# Patient Record
Sex: Male | Born: 1995
Health system: Southern US, Community
[De-identification: ages and names within clinical notes are randomized; demographics above are authoritative.]

## PROBLEM LIST (undated history)

## (undated) DIAGNOSIS — F909 Attention-deficit hyperactivity disorder, unspecified type: Secondary | ICD-10-CM

## (undated) DIAGNOSIS — T7840XA Allergy, unspecified, initial encounter: Secondary | ICD-10-CM

## (undated) HISTORY — PX: TYMPANOSTOMY TUBE PLACEMENT: SHX32

## (undated) HISTORY — DX: Attention-deficit hyperactivity disorder, unspecified type: F90.9

## (undated) HISTORY — DX: Allergy, unspecified, initial encounter: T78.40XA

---
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
## 2017-04-03 ENCOUNTER — Emergency Department (HOSPITAL_COMMUNITY): Payer: Self-pay | Admitting: Emergency Medicine

## 2017-07-04 ENCOUNTER — Other Ambulatory Visit: Payer: Self-pay | Admitting: Family Medicine

## 2017-07-04 NOTE — Telephone Encounter (Signed)
Copied from CRM [phone]. Topic: Quick Communication - Rx Refill/Question >> Jul 04, 2017  4:59 PM Louie BunPalacios Medina, Rosey Batheresa D wrote: Medication: amphetamine-dextroamphetamine (ADDERALL) 15 MG tablet,FLUoxetine (PROZAC) 20 MG tablet  Has the patient contacted their pharmacy? Yes (Agent: If no, request that the patient contact the pharmacy for the refill.) (Agent: If yes, when and what did the pharmacy advise?)  Preferred Pharmacy (with phone number or street name): [id number] - [place], [place] - [address]  Agent: Please be advised that RX refills may take up to 3 business days. We ask that you follow-up with your pharmacy.

## 2017-07-05 NOTE — Telephone Encounter (Signed)
Rx refill request: Adderall 15 mg        Last filled: 12/18/16 # 60  LOV: 09/04/16  PCP: Burchette  Pharmacy: verified

## 2017-07-09 MED ORDER — AMPHETAMINE-DEXTROAMPHETAMINE 15 MG PO TABS: 15.0000 mg | ORAL_TABLET | Freq: Two times a day (BID) | ORAL | 0 refills | Status: DC

## 2017-07-09 NOTE — Telephone Encounter (Signed)
Refill once.  Needs office follow up within next month.

## 2017-08-01 ENCOUNTER — Other Ambulatory Visit: Payer: Self-pay | Admitting: *Deleted

## 2017-08-01 MED ORDER — FLUOXETINE HCL 20 MG PO TABS: 20.0000 mg | ORAL_TABLET | Freq: Every day | ORAL | 0 refills | Status: DC

## 2018-01-26 IMAGING — DX DG HAND COMPLETE 3+V*L*
3 series · 3 of 3 positions shown · non-contrast
Comparison: None.

CLINICAL DATA: Blunt trauma to the thumb with pain, initial
encounter

EXAM:
LEFT HAND - COMPLETE 3+ VIEW

[hand pa]
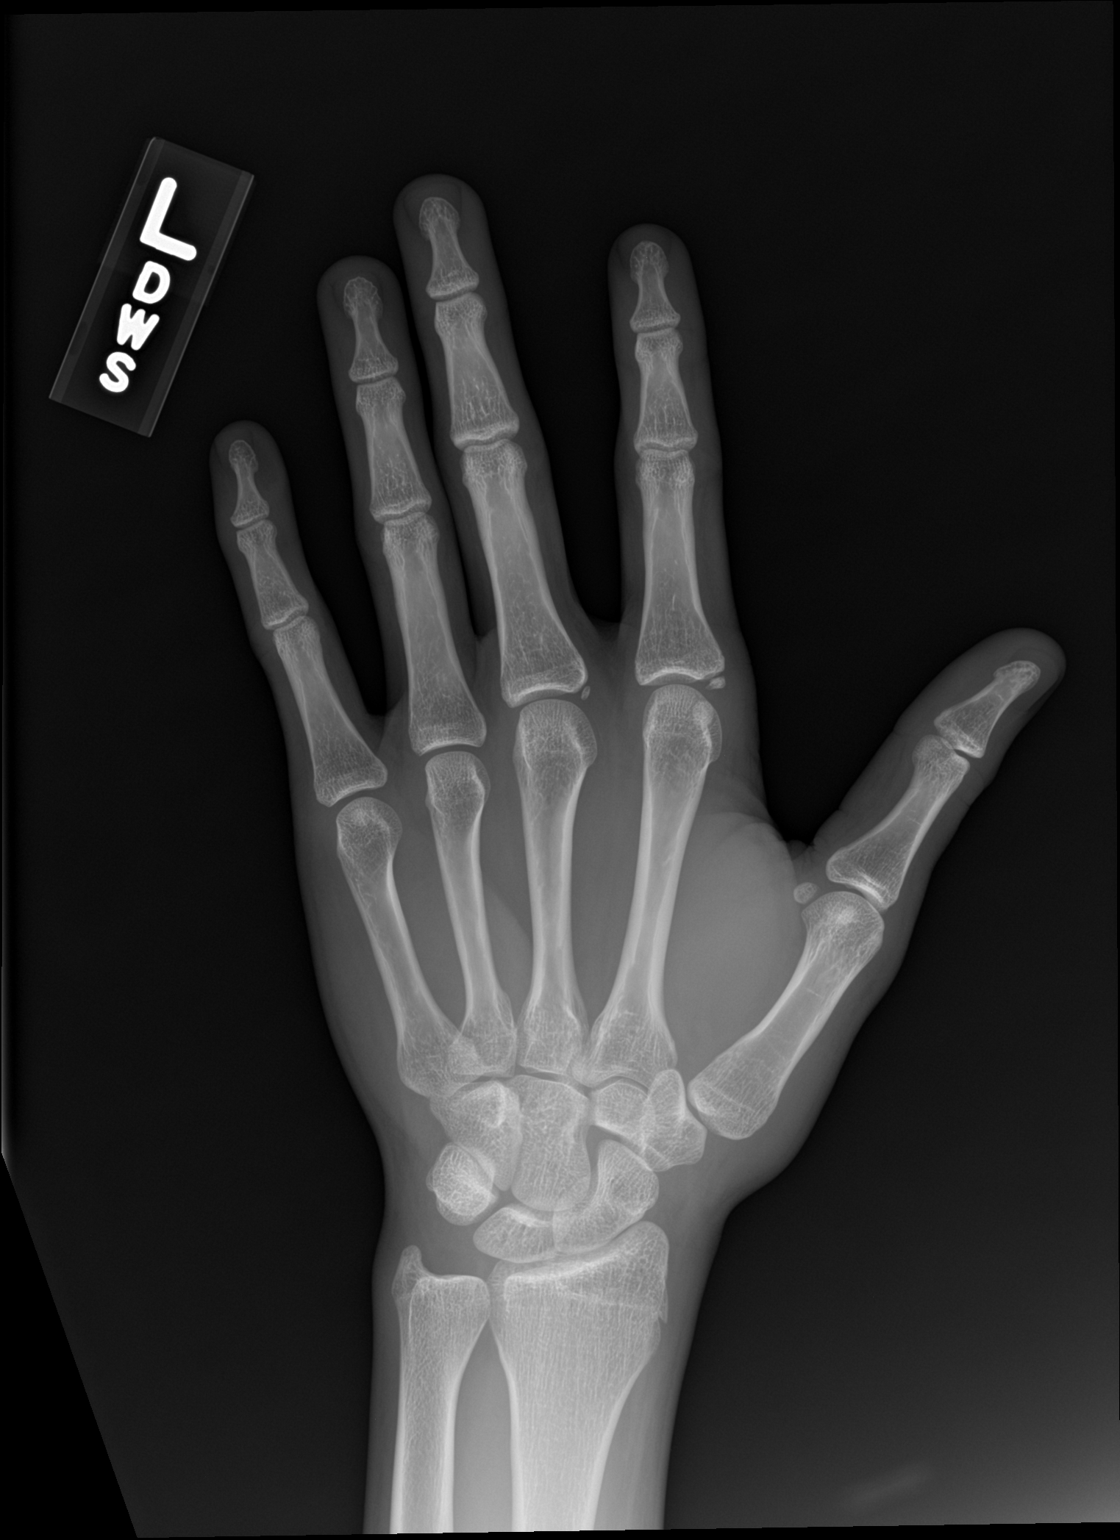

[hand obl]
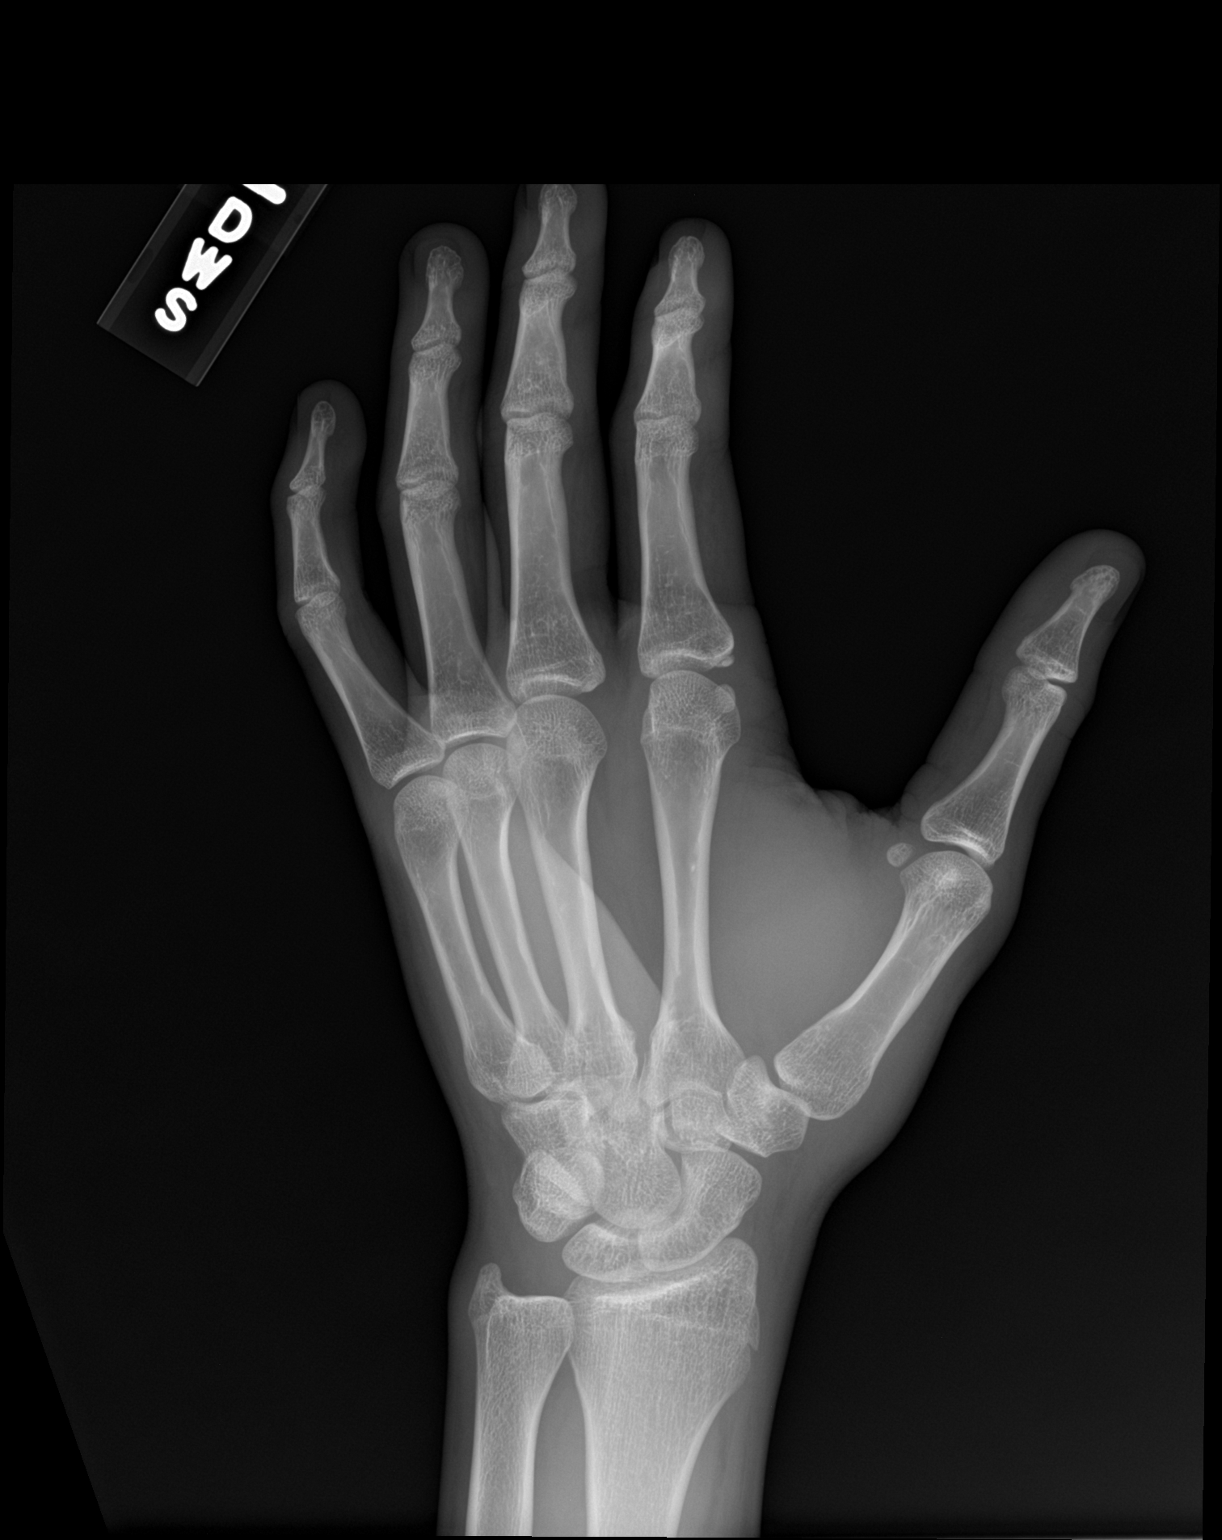

[hand lat]
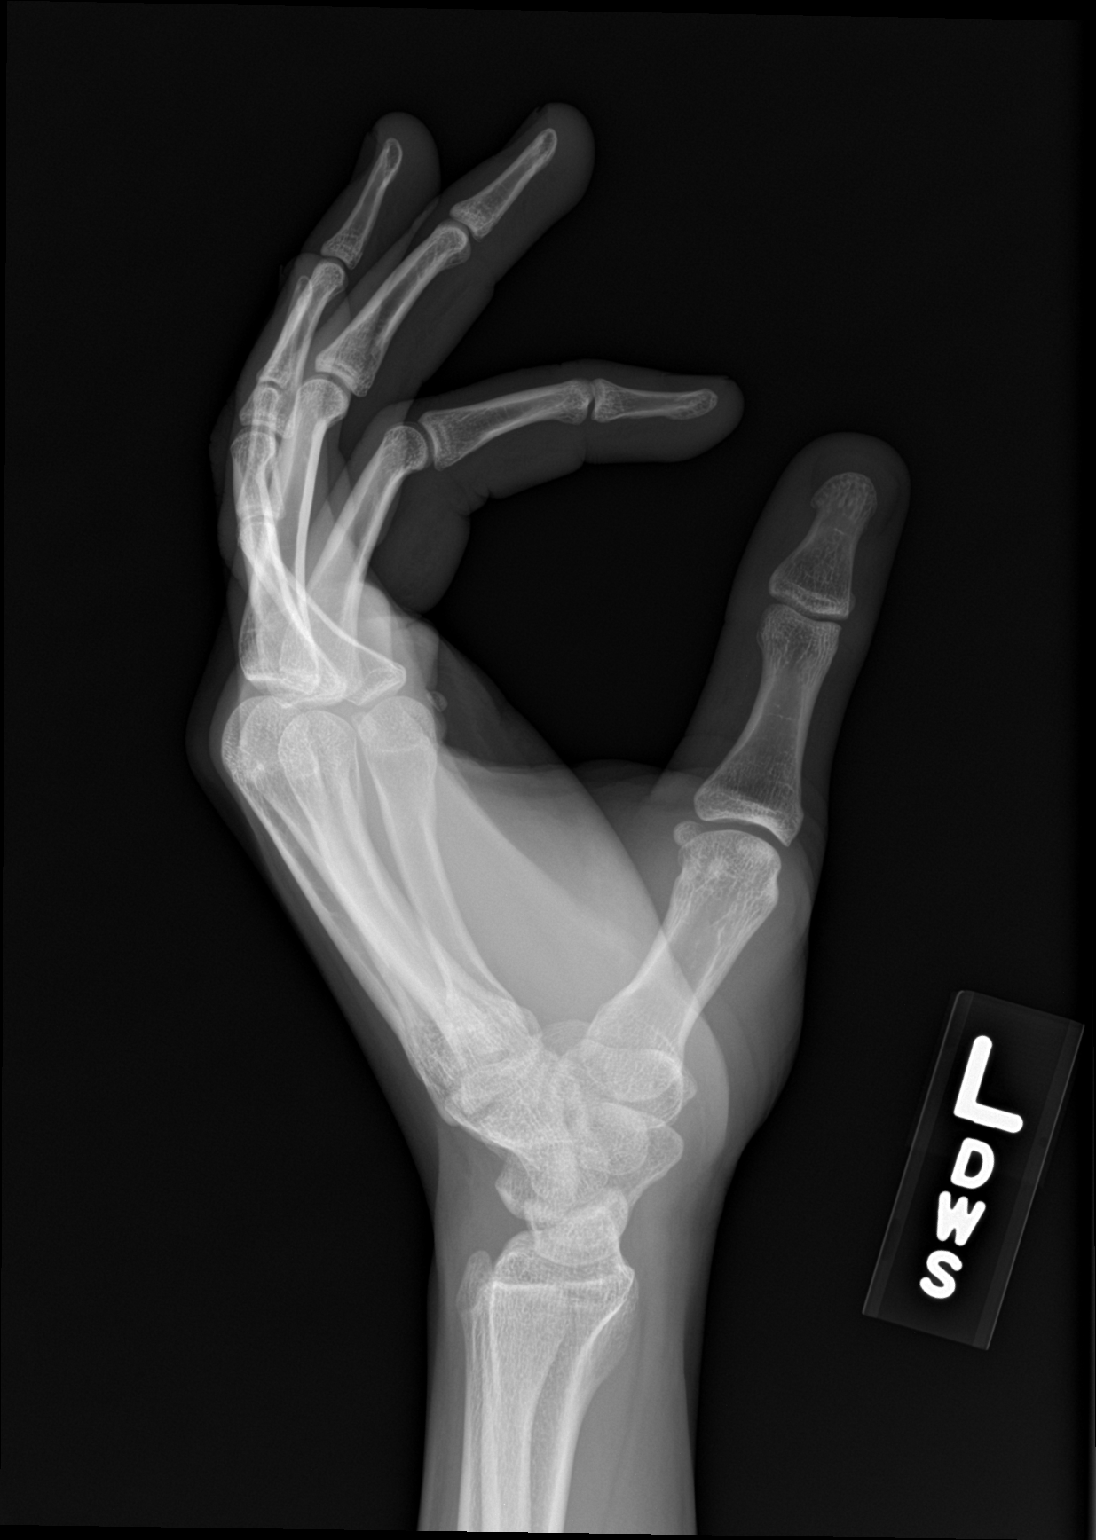

[3 of 3 positions shown; findings below may reference images not displayed]

FINDINGS: There are well corticated bony densities at the base of the second
and third proximal phalanges consistent with prior avulsion
fractures with nonunion. No acute fracture or dislocation is noted.
No soft tissue changes are seen.
IMPRESSION: Findings consistent with prior trauma. No acute abnormality is
noted.

## 2018-03-20 DIAGNOSIS — L0591 Pilonidal cyst without abscess: Secondary | ICD-10-CM | POA: Diagnosis not present

## 2018-03-21 DIAGNOSIS — L0231 Cutaneous abscess of buttock: Secondary | ICD-10-CM | POA: Diagnosis not present

## 2019-09-22 ENCOUNTER — Other Ambulatory Visit: Payer: Self-pay

## 2019-09-23 ENCOUNTER — Ambulatory Visit (INDEPENDENT_AMBULATORY_CARE_PROVIDER_SITE_OTHER): Payer: BLUE CROSS/BLUE SHIELD | Admitting: Family Medicine

## 2019-09-23 DIAGNOSIS — F988 Other specified behavioral and emotional disorders with onset usually occurring in childhood and adolescence: Secondary | ICD-10-CM | POA: Diagnosis not present

## 2019-09-23 DIAGNOSIS — Z91018 Allergy to other foods: Secondary | ICD-10-CM

## 2019-09-23 DIAGNOSIS — Z8659 Personal history of other mental and behavioral disorders: Secondary | ICD-10-CM | POA: Diagnosis not present

## 2019-09-23 MED ORDER — AMPHETAMINE-DEXTROAMPHETAMINE 15 MG PO TABS: 15.0000 mg | ORAL_TABLET | Freq: Two times a day (BID) | ORAL | 0 refills | Status: DC

## 2019-09-23 MED ORDER — EPINEPHRINE 0.3 MG/0.3ML IJ SOAJ: INTRAMUSCULAR | 1 refills | Status: AC

## 2019-09-23 NOTE — Progress Notes (Signed)
Established Patient Office Visit  Subjective:  Patient ID: Nicolas King, male    DOB: 1995-12-03  Age: 24 y.o. MRN: [id number]  CC: No chief complaint on file.   HPI [name] presents for medical follow-up.  He is attending [employer] in his last year.  He is studying Patent attorney.  He does have history of ADD.  He feels that he did well without medication last year with online courses but will be going back in person this year and feels he needs to go back on the Adderall.  Has previously taken Adderall 15 mg twice daily and this does seem to work well for him.  He does have history of depression.  He was started 3 years ago on Prozac for depression.  He was able to taper himself off eventually.  He feels his depression is relatively stable now.  He has a dog which is a mix between a lab and [place] shepherd and he feels that his dog has been 1 interval part of him coping better with depression.  He is specifically requesting a letter for an "emotional support animal "for his dog to be able to stay in his apartment.  Jermayne has history of severe nut allergy.  This is the peanuts and tree nuts.  He avoids these stringently.  Request refill of EpiPen to keep on hand at all times.  Past Medical History:  Diagnosis Date  . ADHD (attention deficit hyperactivity disorder)   . Allergy    diathesis    Past Surgical History:  Procedure Laterality Date  . TYMPANOSTOMY TUBE PLACEMENT      No family history on file.  Social History   Socioeconomic History  . Marital status: Single    Spouse name: Not on file  . Number of children: Not on file  . Years of education: Not on file  . Highest education level: Not on file  Occupational History  . Not on file  Tobacco Use  . Smoking status: Never Smoker  . Smokeless tobacco: Never Used  Substance and Sexual Activity  . Alcohol use: No  . Drug use: No  . Sexual activity: Not on file  Other Topics Concern  . Not on  file  Social History Narrative  . Not on file   Social Determinants of Health   Financial Resource Strain:   . Difficulty of Paying Living Expenses: Not on file  Food Insecurity:   . Worried About Programme researcher, broadcasting/film/video in the Last Year: Not on file  . Ran Out of Food in the Last Year: Not on file  Transportation Needs:   . Lack of Transportation (Medical): Not on file  . Lack of Transportation (Non-Medical): Not on file  Physical Activity:   . Days of Exercise per Week: Not on file  . Minutes of Exercise per Session: Not on file  Stress:   . Feeling of Stress : Not on file  Social Connections:   . Frequency of Communication with Friends and Family: Not on file  . Frequency of Social Gatherings with Friends and Family: Not on file  . Attends Religious Services: Not on file  . Active Member of Clubs or Organizations: Not on file  . Attends Banker Meetings: Not on file  . Marital Status: Not on file  Intimate Partner Violence:   . Fear of Current or Ex-Partner: Not on file  . Emotionally Abused: Not on file  . Physically Abused: Not on file  .  Sexually Abused: Not on file    Outpatient Medications Prior to Visit  Medication Sig Dispense Refill  . amphetamine-dextroamphetamine (ADDERALL) 15 MG tablet Take 1 tablet by mouth 2 (two) times daily. 60 tablet 0  . amphetamine-dextroamphetamine (ADDERALL) 15 MG tablet Take 1 tablet by mouth 2 (two) times daily. 60 tablet 0  . amphetamine-dextroamphetamine (ADDERALL) 15 MG tablet Take 1 tablet by mouth 2 (two) times daily. 60 tablet 0  . EPIPEN 2-PAK 0.3 MG/0.3ML SOAJ injection INJECT 0.3 MLS (0.3 MG TOTAL) INTO THE MUSCLE ONCE. 2 Device 0  . FLUoxetine (PROZAC) 20 MG tablet Take 1 tablet (20 mg total) by mouth daily. 90 tablet 0   No facility-administered medications prior to visit.    Allergies  Allergen Reactions  . Peanut-Containing Drug Products     REACTION: Epi pen for anaphylaxis    ROS Review of Systems    Constitutional: Negative for appetite change, chills, fever and unexpected weight change.  Respiratory: Negative for shortness of breath.   Cardiovascular: Negative for chest pain.  Neurological: Negative for dizziness and headaches.  Psychiatric/Behavioral: Negative for dysphoric mood.      Objective:    Physical Exam Vitals reviewed.  Constitutional:      Appearance: Normal appearance.  Cardiovascular:     Rate and Rhythm: Normal rate and regular rhythm.  Pulmonary:     Effort: Pulmonary effort is normal.     Breath sounds: Normal breath sounds.  Musculoskeletal:     Cervical back: Neck supple.  Lymphadenopathy:     Cervical: No cervical adenopathy.  Neurological:     Mental Status: He is alert.     There were no vitals taken for this visit. Wt Readings from Last 3 Encounters:  09/04/16 172 lb 8 oz (78.2 kg) (76 %, Z= 0.72)*  07/12/16 174 lb 4.8 oz (79.1 kg) (79 %, Z= 0.79)*  06/02/16 175 lb 1.6 oz (79.4 kg) (80 %, Z= 0.83)*   * Growth percentiles are based on CDC (Boys, 2-20 Years) data.     Health Maintenance Due  Topic Date Due  . Hepatitis C Screening  Never done  . HIV Screening  Never done  . TETANUS/TDAP  Never done  . INFLUENZA VACCINE  08/24/2019    There are no preventive care reminders to display for this patient.  Lab Results  Component Value Date   TSH 1.66 12/23/2010   Lab Results  Component Value Date   WBC 8.7 12/23/2010   HGB 12.8 (L) 12/23/2010   HCT 38.0 (L) 12/23/2010   MCV 84.3 12/23/2010   PLT 328.0 12/23/2010   Lab Results  Component Value Date   NA 141 12/23/2010   K 3.9 12/23/2010   CO2 27 12/23/2010   GLUCOSE 78 12/23/2010   BUN 17 12/23/2010   CREATININE 0.7 12/23/2010   BILITOT 0.2 (L) 12/23/2010   ALKPHOS 256 (H) 12/23/2010   AST 25 12/23/2010   ALT 21 12/23/2010   PROT 7.5 12/23/2010   ALBUMIN 4.4 12/23/2010   CALCIUM 9.2 12/23/2010   GFR 157.97 12/23/2010   No results found for: CHOL No results found for:  HDL No results found for: LDLCALC No results found for: TRIG No results found for: CHOLHDL No results found for: JXBJ4N    Assessment & Plan:   #1 attention deficit disorder.  Currently not on medication.  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  Currently stable off Prozac -Patient requesting letter for emotional support animal to keep his dog's apartment.  He does relate the fact that his dog has been integral part of him recovering from depression  Meds ordered this encounter  Medications  . EPINEPHrine (EPIPEN 2-PAK) 0.3 mg/0.3 mL IJ SOAJ injection    Sig: INJECT 0.3 MLS (0.3 MG TOTAL) INTO THE MUSCLE ONCE.    Dispense:  2 each    Refill:  1  . amphetamine-dextroamphetamine (ADDERALL) 15 MG tablet    Sig: Take 1 tablet by mouth 2 (two) times daily.    Dispense:  60 tablet    Refill:  0    Fill in two months  . amphetamine-dextroamphetamine (ADDERALL) 15 MG tablet    Sig: Take 1 tablet by mouth 2 (two) times daily.    Dispense:  60 tablet    Refill:  0    Fill in one month  . amphetamine-dextroamphetamine (ADDERALL) 15 MG tablet    Sig: Take 1 tablet by mouth 2 (two) times daily.    Dispense:  60 tablet    Refill:  0    Follow-up: No follow-ups on file.    Evelena Peat, MD

## 2019-09-25 ENCOUNTER — Encounter: Payer: Self-pay | Admitting: Family Medicine

## 2020-01-05 DIAGNOSIS — J101 Influenza due to other identified influenza virus with other respiratory manifestations: Secondary | ICD-10-CM | POA: Diagnosis not present

## 2020-01-05 DIAGNOSIS — Z20822 Contact with and (suspected) exposure to covid-19: Secondary | ICD-10-CM | POA: Diagnosis not present

## 2020-01-05 DIAGNOSIS — J029 Acute pharyngitis, unspecified: Secondary | ICD-10-CM | POA: Diagnosis not present

## 2020-01-06 ENCOUNTER — Telehealth: Payer: BC Managed Care – PPO | Admitting: Family Medicine

## 2020-03-10 ENCOUNTER — Other Ambulatory Visit: Payer: Self-pay

## 2020-03-10 ENCOUNTER — Encounter (HOSPITAL_COMMUNITY): Payer: Self-pay

## 2020-03-10 ENCOUNTER — Emergency Department
Admission: EM | Admit: 2020-03-10 | Discharge: 2020-03-10 | Disposition: A | Discharge status: Home / Self Care | Attending: Emergency Medicine | Admitting: Emergency Medicine

## 2020-03-10 DIAGNOSIS — S0101XA Laceration without foreign body of scalp, initial encounter: Secondary | ICD-10-CM | POA: Insufficient documentation

## 2020-03-10 DIAGNOSIS — Z23 Encounter for immunization: Secondary | ICD-10-CM | POA: Insufficient documentation

## 2020-03-10 DIAGNOSIS — Y990 Civilian activity done for income or pay: Secondary | ICD-10-CM | POA: Insufficient documentation

## 2020-03-10 DIAGNOSIS — W208XXA Other cause of strike by thrown, projected or falling object, initial encounter: Secondary | ICD-10-CM | POA: Insufficient documentation

## 2020-03-10 MED ORDER — DIPHTH,PERTUSSIS(ACEL),TETANUS 2.5 LF UNIT-8 MCG-5 LF/0.5ML IM SYRINGE
0.5000 mL | INJECTION | INTRAMUSCULAR | Status: AC
Start: 2020-03-10 — End: 2020-03-10
  Administered 2020-03-10: 11:00:00 0.5 mL via INTRAMUSCULAR
  Filled 2020-03-10: qty 0.5

## 2020-03-10 MED ORDER — BACITRACIN 500 UNIT/GRAM TOPICAL PACKET
1.0000 | PACK | CUTANEOUS | Status: AC
Start: 2020-03-10 — End: 2020-03-10
  Administered 2020-03-10: 1 via TOPICAL

## 2020-03-10 NOTE — ED Provider Notes (Signed)
Department of Emergency Medicine  [facility]  03/10/2020            Patient is a 25 y.o. male presenting to the ED with chief complaint of Head Laceration      HPI   This is a 25 year old male who presents to the ED via private vehicle with the above.  Patient reports he sustained injury to the left side of his head this morning around 0930 when a box fell and hit him on the head causing a laceration.  Denies other injuries as well as any loss of consciousness.  Denies any nausea, vomiting, visual changes and no other concerns voiced.  He is not diabetic.    History reviewed. No pertinent past medical history.  History reviewed. No pertinent surgical history.  No Known Allergies   No current outpatient medications on file.      Above history reviewed.     Review of Systems   Gastrointestinal: Negative for nausea and vomiting.   Musculoskeletal: Negative for back pain and neck pain.   Neurological: Negative for dizziness, loss of consciousness and headaches.        All other pertinent systems reviewed and are negative, unless commented on in the HPI.       Filed Vitals:    03/10/20 1008   BP: 135/86   Pulse: 79   Resp: 16   Temp: 36.7 C (98 F)   SpO2: 100%       Physical Exam  Vitals reviewed.   Constitutional:       Comments: This is a pleasant well-nourished well-developed 25 year old male seen sitting in bed resting comfortably, nontoxic in appearance, no acute distress, he is noted have a superficial laceration to the left scalp, bleeding controlled   HENT:      Head: Normocephalic.      Comments: Scalp laceration     Right Ear: Tympanic membrane normal.      Left Ear: Tympanic membrane normal.      Mouth/Throat:      Mouth: Mucous membranes are moist.      Pharynx: Oropharynx is clear.   Eyes:      Pupils: Pupils are equal, round, and reactive to light.   Cardiovascular:      Rate and Rhythm: Normal rate and regular rhythm.      Heart sounds: Normal heart sounds.   Pulmonary:      Breath  sounds: Normal breath sounds.   Musculoskeletal:      Cervical back: Normal range of motion and neck supple.      Comments: MAEW   Skin:     General: Skin is warm and dry.   Neurological:      General: No focal deficit present.      Mental Status: He is oriented to person, place, and time.   Psychiatric:         Behavior: Behavior normal.          LAC REPAIR/WOUND CLOSURE    Date/Time: 03/10/2020 11:12 AM  Performed by: Sharlotte Alamo, DO  Authorized by: Sharlotte Alamo, DO     Consent:     Consent obtained:  Verbal  Anesthesia:     Anesthesia method:  Local infiltration    Local anesthetic:  Lidocaine 1% WITH epi  Laceration details:     Location:  Scalp    Length (cm):  1.5    Depth (mm):  1  Exploration:     Limited defect  created (wound extended): no      Wound exploration: entire depth of wound visualized    Treatment:     Area cleansed with:  Saline    Amount of cleaning:  Standard    Irrigation solution:  Sterile saline    Irrigation volume:  300    Irrigation method:  Syringe    Visualized foreign bodies/material removed: no      Debridement:  None  Skin repair:     Repair method:  Staples    Number of staples:  2  Approximation:     Approximation:  Close  Post-procedure details:     Dressing:  Antibiotic ointment    Procedure completion:  Tolerated well, no immediate complications         Workup:     No results found for this or any previous visit (from the past 24 hour(s)).         Orders Placed This Encounter    BEDSIDE  WOUND CLOSURE / LAC REPAIR    bacitracin 500 units/gram topical ointment packet    diphtheria, pertussis-acell, tetanus (BOOSTRIX) IM injection           Abnormal Lab results:  Labs Reviewed - No data to display    ER Course:  Appropriate labs and imaging ordered. Medical Records reviewed.   During the patient's stay in the emergency department, the above listed imaging and/or labs were performed to assist with medical decision making and were reviewed by myself when available for  review.       Pt remained stable throughout the emergency department course.      Impression:    Encounter Diagnosis   Name Primary?    Laceration of scalp, initial encounter Yes      Disposition:  Discharged     Follow up with primary provider of choice or with Nunzio Cobbs, PA-C next week.  Keep area clean and dry and monitor for signs of infection including redness, discharge, fever.  Return to ER for wound check and possible staple removal in 5-7 days, sooner with bleeding, signs of infection, confusion, or any concerns.    Follow Up Plan:  Follow-up with PCP or return to the emergency room with any worsening symptoms.    No follow-ups on file.   New Prescriptions    No medications on file        Sharlotte Alamo, DO

## 2020-03-10 NOTE — ED Nurses Note (Signed)
Unknown tetanus status

## 2020-03-10 NOTE — Discharge Instructions (Signed)
Follow up with primary provider of choice or with Nunzio Cobbs, PA-C next week.  Keep area clean and dry and monitor for signs of infection including redness, discharge, fever.  Return to ER for wound check and possible staple removal in 5-7 days, sooner with bleeding, signs of infection, confusion, or any concerns.

## 2020-03-10 NOTE — ED Triage Notes (Signed)
Box fell and hit pt in the head at around 0930 this am at work.  approx 1" lac to lt side of head.

## 2020-03-17 ENCOUNTER — Emergency Department
Admission: EM | Admit: 2020-03-17 | Discharge: 2020-03-17 | Disposition: A | Discharge status: Home / Self Care | Payer: 59 | Attending: Emergency Medicine | Admitting: Emergency Medicine

## 2020-03-17 ENCOUNTER — Encounter (HOSPITAL_COMMUNITY): Payer: Self-pay

## 2020-03-17 DIAGNOSIS — Z4802 Encounter for removal of sutures: Secondary | ICD-10-CM

## 2020-03-17 DIAGNOSIS — S0101XD Laceration without foreign body of scalp, subsequent encounter: Secondary | ICD-10-CM | POA: Insufficient documentation

## 2020-03-17 DIAGNOSIS — Z5189 Encounter for other specified aftercare: Secondary | ICD-10-CM

## 2020-03-17 NOTE — Discharge Instructions (Addendum)
Continue to keep wound clean and dry.  Follow-up with your PCP as needed.

## 2020-03-17 NOTE — ED Triage Notes (Signed)
Patient states injury last Wednesday here for staple removal

## 2020-03-17 NOTE — ED Nurses Note (Signed)
Patient reports talking to provider about discharge plan of care. Declines assistance to vehicle. No concerns. Verbalizes understanding of discharge instructions.

## 2020-03-17 NOTE — ED Provider Notes (Signed)
Department of Emergency Medicine  [facility]  03/17/2020      Patient is a 25 y.o. male presenting to the ED with chief complaint of Staple Removal       HPI   25 year old male presents to the emergency department for recheck of scalp wound and staple removal.  Injury occurred 7 days ago where a box hit him in the head.  He denies any increased pain, headache or wound complications.    Review of Systems   HENT: Negative for ear pain.    Eyes: Negative for double vision.   Gastrointestinal: Negative for nausea and vomiting.   Musculoskeletal: Negative for neck pain.   Skin:        Scalp wound   Neurological: Negative for dizziness and headaches.   All other systems reviewed and are negative.       All other systems reviewed and are negative, unless commented on in the HPI.     History reviewed. No pertinent past medical history.  History reviewed. No pertinent surgical history.  No Known Allergies   No current outpatient medications on file.      Above history reviewed with patient.  Previous records also reviewed.     Physical Exam:     Filed Vitals:    03/17/20 1715   BP: 136/82   Pulse: 60   Resp: 16   Temp: 36.4 C (97.5 F)   SpO2: 100%       Physical Exam  Vitals and nursing note reviewed.   Constitutional:       Appearance: Normal appearance.   HENT:      Head: Normocephalic.      Mouth/Throat:      Mouth: Mucous membranes are moist.   Eyes:      Extraocular Movements: Extraocular movements intact.      Pupils: Pupils are equal, round, and reactive to light.   Cardiovascular:      Rate and Rhythm: Normal rate.   Pulmonary:      Effort: Pulmonary effort is normal. No respiratory distress.   Abdominal:      General: There is no distension.   Musculoskeletal:         General: Normal range of motion.      Cervical back: Neck supple.   Skin:     General: Skin is warm and dry.      Comments: Two staples removed from well-healed scalp laceration.   Neurological:      General: No focal deficit present.       Mental Status: He is alert.   Psychiatric:         Mood and Affect: Mood normal.         Behavior: Behavior normal.        Therapy/Procedures/Course/MDM:    Patient vitally stable throughout stay in the ED.   Staples removed from scalp laceration.  Discussed continuation of wound care.     Impression:    Encounter Diagnoses   Name Primary?    Encounter for staple removal Yes    Visit for wound check        Disposition:    Discharged     Following the above history, physical exam, and studies, the patient was deemed stable and suitable for discharge.    It was advised that the patient return to the ED with any new, concerning or worsening symptoms and follow up as directed.    Return precautions reviewed.    The  patient verbalized understanding of all instructions, had no further questions or concerns, and agreed to plan.      Follow Up:    [facility]  [address]  [place] [address]  [phone]    As needed    No future appointments.     Prescriptions:      Current Discharge Medication List      You have not been prescribed any medications.         Attestation:     The co-signing Dr was present in the ED and available for consultation and did participate in the care of the patient.     This note was partly generated using a voice recognition software; please excuse any grammatical errors.       Almond Lint, PA-C  03/17/2020, 17:19

## 2020-03-22 ENCOUNTER — Encounter (INDEPENDENT_AMBULATORY_CARE_PROVIDER_SITE_OTHER): Payer: Self-pay | Admitting: Nurse Practitioner

## 2020-03-22 ENCOUNTER — Ambulatory Visit: Payer: 59 | Attending: Nurse Practitioner | Admitting: Nurse Practitioner

## 2020-03-22 ENCOUNTER — Other Ambulatory Visit: Payer: Self-pay

## 2020-03-22 VITALS — BP 134/84 | HR 66 | Temp 98.1°F | Resp 20 | Ht 70.0 in | Wt 165.0 lb

## 2020-03-22 DIAGNOSIS — H1032 Unspecified acute conjunctivitis, left eye: Secondary | ICD-10-CM | POA: Insufficient documentation

## 2020-03-22 DIAGNOSIS — L309 Dermatitis, unspecified: Secondary | ICD-10-CM | POA: Insufficient documentation

## 2020-03-22 MED ORDER — POLYMYXIN B SULFATE 10,000 UNIT-TRIMETHOPRIM 1 MG/ML EYE DROPS
1.0000 [drp] | Freq: Four times a day (QID) | OPHTHALMIC | 0 refills | Status: AC
Start: 2020-03-22 — End: 2020-03-29

## 2020-03-22 NOTE — Progress Notes (Signed)
URGENT CARE, [facility]  [address]  [place] [address]  Phone: [phone]  Fax: [phone]    Encounter Date: 03/22/2020    Patient ID:  Luke Lara  OZD:[id number]    DOB: 08-29-1995  Age: 25 y.o. male    Subjective:     Chief Complaint   Patient presents with    Pink Eye     Right eye      The patient presents to the clinic with left eye redness that started yesterday morning. He was bitten by a lady bug yesterday morning. He has significant allergies to lady bugs. His left eye was matted shut this morning and he was sent home from work.     The history is provided by the patient.   Pink Eye  Associated symptoms include a rash. Pertinent negatives include no chest pain, chills, congestion, coughing, fever, nausea, sore throat or vomiting.     Current Outpatient Medications   Medication Sig    polymyxin B sulf-trimethoprim (POLYTRIM) 10,000 unit- 1 mg/mL Ophthalmic Drops Administer 1 Drop into the left eye Every 6 hours for 7 days     No Known Allergies  History reviewed. No pertinent past medical history.        Family Medical History:    None         Social History     Tobacco Use    Smoking status: Current Every Day Smoker     Packs/day: 0.50    Smokeless tobacco: Never Used   Vaping Use    Vaping Use: Every day       Review of Systems   Constitutional: Negative for chills and fever.   HENT: Negative for congestion, ear pain, rhinorrhea and sore throat.    Eyes: Positive for discharge, redness and itching.   Respiratory: Negative for cough, shortness of breath and wheezing.    Cardiovascular: Negative for chest pain.   Gastrointestinal: Negative for diarrhea, nausea and vomiting.   Skin: Positive for rash.     Objective:   Vitals: BP 134/84 (Site: Right, Patient Position: Sitting, Cuff Size: Adult)    Pulse 66    Temp 36.7 C (98.1 F) (Temporal)    Resp 20    Ht 1.778 m (5\' 10" )    Wt 74.8 kg (165 lb)    SpO2 98%    BMI 23.68 kg/m         Physical Exam  Constitutional:        General: He is not in acute distress.  HENT:      Head: Normocephalic and atraumatic.      Right Ear: Tympanic membrane and ear canal normal.      Left Ear: Tympanic membrane and ear canal normal.      Nose: Nose normal.      Mouth/Throat:      Pharynx: Oropharynx is clear.   Eyes:      Conjunctiva/sclera:      Right eye: Right conjunctiva is not injected.      Left eye: Left conjunctiva is injected.   Cardiovascular:      Rate and Rhythm: Normal rate and regular rhythm.      Heart sounds: No murmur heard.  Pulmonary:      Breath sounds: No wheezing, rhonchi or rales.   Musculoskeletal:      Cervical back: Neck supple.   Lymphadenopathy:      Cervical: No cervical adenopathy.   Skin:     General:  Skin is warm and dry.      Findings: Rash (mildly erythematous papular rash of bilateral AC fossa) present.   Neurological:      General: No focal deficit present.      Mental Status: He is alert and oriented to person, place, and time.         Assessment & Plan:     ENCOUNTER DIAGNOSES     ICD-10-CM   1. Acute bacterial conjunctivitis of left eye  H10.32   2. Eczema, unspecified type  L30.9       Orders Placed This Encounter    polymyxin B sulf-trimethoprim (POLYTRIM) 10,000 unit- 1 mg/mL Ophthalmic Drops     Work excuse provided  Polytrim eye gtts as directed  Pt declined Rx for steroid cream for his eczema, he has been using hydrocortisone cream  Advised applying an emollient like vaseline over the hydrocortisone cream 1-2 times per day to control eczema  Return if symptoms worsen or fail to improve.    Marianna Fuss, NP

## 2020-06-05 DIAGNOSIS — Z20822 Contact with and (suspected) exposure to covid-19: Secondary | ICD-10-CM | POA: Diagnosis not present

## 2020-06-05 DIAGNOSIS — Z03818 Encounter for observation for suspected exposure to other biological agents ruled out: Secondary | ICD-10-CM | POA: Diagnosis not present

## 2020-08-20 ENCOUNTER — Telehealth: Payer: Self-pay | Admitting: Family Medicine

## 2020-08-20 MED ORDER — AMPHETAMINE-DEXTROAMPHETAMINE 15 MG PO TABS: 15.0000 mg | ORAL_TABLET | Freq: Two times a day (BID) | ORAL | 0 refills | Status: DC

## 2020-08-20 NOTE — Telephone Encounter (Signed)
Pt call and stated he need a refill on amphetamine-dextroamphetamine (ADDERALL) 15 MG tablet sent to  [id number] - [place], [place] - [address] Phone:  [phone]  Fax:  [phone]

## 2020-08-20 NOTE — Telephone Encounter (Signed)
Refilled once.  Does need office follow-up prior to further refills.  Make sure he is aware to set up.  Also confirm that correct pharmacy is [facility] [place].

## 2021-09-19 ENCOUNTER — Ambulatory Visit (INDEPENDENT_AMBULATORY_CARE_PROVIDER_SITE_OTHER): Payer: BC Managed Care – PPO | Admitting: Family Medicine

## 2021-09-19 ENCOUNTER — Encounter: Payer: Self-pay | Admitting: Family Medicine

## 2021-09-19 VITALS — BP 120/70 | HR 70 | Temp 98.2°F | Ht 71.0 in | Wt 230.2 lb

## 2021-09-19 DIAGNOSIS — F988 Other specified behavioral and emotional disorders with onset usually occurring in childhood and adolescence: Secondary | ICD-10-CM

## 2021-09-19 MED ORDER — AMPHETAMINE-DEXTROAMPHETAMINE 10 MG PO TABS: 10.0000 mg | ORAL_TABLET | Freq: Two times a day (BID) | ORAL | 0 refills | Status: AC

## 2021-09-19 NOTE — Progress Notes (Signed)
   Established Patient Office Visit  Subjective   Patient ID: Nicolas King, male    DOB: 1995/08/26  Age: 26 y.o. MRN: [id number]  Chief Complaint  Patient presents with   Medication Refill    HPI   Here for follow-up regarding ADD.  He does not take Adderall daily.  Had been on dose of 15 mg but was having consistent headaches.  When he broke the tablet in half his headaches went away.  He would like to consider lower dose such as 10 mg.  Has been able to focus with lower dose very well.  Continues to work for [employer].  Lives in [place].  Doing very well overall.  Considering going back to [employer] for masters degree.  No recent chest pains.  Past Medical History:  Diagnosis Date   ADHD (attention deficit hyperactivity disorder)    Allergy    diathesis   Past Surgical History:  Procedure Laterality Date   TYMPANOSTOMY TUBE PLACEMENT      reports that he has never smoked. He has never used smokeless tobacco. He reports that he does not drink alcohol and does not use drugs. family history is not on file. Allergies  Allergen Reactions   Peanut-Containing Drug Products     REACTION: Epi pen for anaphylaxis    Review of Systems  Constitutional:  Negative for malaise/fatigue.  Eyes:  Negative for blurred vision.  Respiratory:  Negative for shortness of breath.   Cardiovascular:  Negative for chest pain.  Neurological:  Negative for dizziness and weakness.       See HPI      Objective:     BP 120/70 (BP Location: Left Arm, Patient Position: Sitting, Cuff Size: Normal)   Pulse 70   Temp 98.2 F (36.8 C) (Oral)   Ht 5\' 11"  (1.803 m)   Wt 230 lb 4 oz (104.4 kg)   SpO2 98%   BMI 32.11 kg/m  BP Readings from Last 3 Encounters:  09/19/21 120/70  09/04/16 100/70  07/12/16 120/80   Wt Readings from Last 3 Encounters:  09/19/21 230 lb 4 oz (104.4 kg)  09/04/16 172 lb 8 oz (78.2 kg) (76 %, Z= 0.72)*  07/12/16 174 lb 4.8 oz (79.1 kg) (79 %, Z= 0.79)*    * Growth percentiles are based on CDC (Boys, 2-20 Years) data.      Physical Exam Vitals reviewed.  Constitutional:      Appearance: Normal appearance.  Cardiovascular:     Rate and Rhythm: Normal rate and regular rhythm.  Pulmonary:     Effort: Pulmonary effort is normal.     Breath sounds: Normal breath sounds.  Musculoskeletal:     Right lower leg: No edema.     Left lower leg: No edema.  Neurological:     Mental Status: He is alert.      No results found for any visits on 09/19/21.    The ASCVD Risk score (Arnett DK, et al., 2019) failed to calculate for the following reasons:   The 2019 ASCVD risk score is only valid for ages 88 to 110    Assessment & Plan:   Problem List Items Addressed This Visit       Unprioritized   Attention deficit disorder - Primary  We will try lower dose of Adderall at 10 mg once or twice daily as needed.  Refill for #60  No follow-ups on file.    76, MD

## 2021-09-23 ENCOUNTER — Ambulatory Visit (INDEPENDENT_AMBULATORY_CARE_PROVIDER_SITE_OTHER): Payer: Self-pay | Admitting: Otolaryngology

## 2021-10-10 ENCOUNTER — Ambulatory Visit (INDEPENDENT_AMBULATORY_CARE_PROVIDER_SITE_OTHER): Payer: 59 | Admitting: Otolaryngology

## 2022-01-04 ENCOUNTER — Ambulatory Visit (INDEPENDENT_AMBULATORY_CARE_PROVIDER_SITE_OTHER): Payer: BC Managed Care – PPO | Admitting: Family Medicine

## 2022-01-04 ENCOUNTER — Encounter: Payer: Self-pay | Admitting: Family Medicine

## 2022-01-04 VITALS — BP 120/78 | HR 89 | Temp 98.3°F | Wt 229.5 lb

## 2022-01-04 DIAGNOSIS — J029 Acute pharyngitis, unspecified: Secondary | ICD-10-CM

## 2022-01-04 DIAGNOSIS — J020 Streptococcal pharyngitis: Secondary | ICD-10-CM

## 2022-01-04 LAB — POCT INFLUENZA A/B
Influenza A, POC: NEGATIVE
Influenza B, POC: NEGATIVE

## 2022-01-04 LAB — POC COVID19 BINAXNOW: SARS Coronavirus 2 Ag: NEGATIVE

## 2022-01-04 LAB — POCT RAPID STREP A (OFFICE): Rapid Strep A Screen: POSITIVE — AB

## 2022-01-04 MED ORDER — AMOXICILLIN 875 MG PO TABS: 875.0000 mg | ORAL_TABLET | Freq: Two times a day (BID) | ORAL | 0 refills | Status: AC

## 2022-01-04 NOTE — Progress Notes (Signed)
Established Patient Office Visit  Subjective   Patient ID: Nicolas King, male    DOB: Apr 04, 1995  Age: 26 y.o. MRN: [id number]  Chief Complaint  Patient presents with   Sore Throat    HPI   Nicolas King seen with sore throat which started yesterday.  No fever.  No chills.  No significant cough or nasal congestion.  No skin rashes.  No known sick contacts.  Denies any nausea or vomiting.  Has had strep throat frequently in the past.  Mild malaise.  Past Medical History:  Diagnosis Date   ADHD (attention deficit hyperactivity disorder)    Allergy    diathesis   Past Surgical History:  Procedure Laterality Date   TYMPANOSTOMY TUBE PLACEMENT      reports that he has never smoked. He has never used smokeless tobacco. He reports that he does not drink alcohol and does not use drugs. family history is not on file. Allergies  Allergen Reactions   Peanut-Containing Drug Products     REACTION: Epi pen for anaphylaxis    Review of Systems  Constitutional:  Negative for chills and fever.  HENT:  Positive for sore throat. Negative for congestion.   Respiratory:  Negative for cough.   Skin:  Negative for rash.      Objective:     BP 120/78 (BP Location: Right Arm, Patient Position: Sitting, Cuff Size: Normal)   Pulse 89   Temp 98.3 F (36.8 C) (Other (Comment))   Wt 229 lb 8 oz (104.1 kg)   SpO2 99%   BMI 32.01 kg/m    Physical Exam Vitals reviewed.  Constitutional:      General: He is not in acute distress.    Appearance: He is well-developed. He is not ill-appearing or toxic-appearing.  HENT:     Right Ear: Tympanic membrane normal.     Left Ear: Tympanic membrane normal.     Mouth/Throat:     Comments: Mild posterior pharynx erythema.  No exudate. Cardiovascular:     Rate and Rhythm: Normal rate and regular rhythm.  Pulmonary:     Effort: Pulmonary effort is normal. No respiratory distress.     Breath sounds: No rales.  Musculoskeletal:     Cervical back:  Neck supple.  Lymphadenopathy:     Cervical: No cervical adenopathy.  Neurological:     Mental Status: He is alert.      Results for orders placed or performed in visit on 01/04/22  POC COVID-19  Result Value Ref Range   SARS Coronavirus 2 Ag Negative Negative  POC Influenza A/B  Result Value Ref Range   Influenza A, POC Negative Negative   Influenza B, POC Negative Negative  POC Rapid Strep A  Result Value Ref Range   Rapid Strep A Screen Positive (A) Negative      The ASCVD Risk score (Arnett DK, et al., 2019) failed to calculate for the following reasons:   The 2019 ASCVD risk score is only valid for ages 79 to 36    Assessment & Plan:   Problem List Items Addressed This Visit   None Visit Diagnoses     Sore throat    -  Primary   Relevant Orders   POC COVID-19 (Completed)   POC Influenza A/B (Completed)   POC Rapid Strep A (Completed)     Positive rapid strep.  COVID negative.  Influenza negative.  -Start amoxicillin 875 mg twice daily for 10 days -Over-the-counter analgesics as needed -Follow-up for  any persistent or worsening symptoms  No follow-ups on file.    Evelena Peat, MD

## 2022-01-09 ENCOUNTER — Ambulatory Visit: Payer: 59 | Attending: Nurse Practitioner | Admitting: Nurse Practitioner

## 2022-01-09 ENCOUNTER — Encounter (INDEPENDENT_AMBULATORY_CARE_PROVIDER_SITE_OTHER): Payer: Self-pay | Admitting: Nurse Practitioner

## 2022-01-09 ENCOUNTER — Other Ambulatory Visit: Payer: Self-pay

## 2022-01-09 VITALS — BP 122/78 | HR 74 | Temp 97.7°F | Resp 18 | Ht 70.0 in | Wt 181.0 lb

## 2022-01-09 DIAGNOSIS — U071 COVID-19: Secondary | ICD-10-CM | POA: Insufficient documentation

## 2022-01-09 DIAGNOSIS — F17210 Nicotine dependence, cigarettes, uncomplicated: Secondary | ICD-10-CM | POA: Insufficient documentation

## 2022-01-09 DIAGNOSIS — R059 Cough, unspecified: Secondary | ICD-10-CM | POA: Insufficient documentation

## 2022-01-09 LAB — POCT RAPID COVID (SOFIA) (AMB ONLY): COVID-19 AG: NEGATIVE

## 2022-01-09 NOTE — Nursing Note (Signed)
Travel Screening       Question Response    Have you been in contact with someone who was sick? --    Do you have any of the following new or worsening symptoms? None of these    Have you traveled internationally in the last month? No          Travel History   Travel since 12/10/21    No documented travel since 12/10/21       Luke Halley, LPN

## 2022-01-09 NOTE — Nursing Note (Signed)
Office Visit on 01/09/2022   Component Date Value Ref Range Status    COVID-19 AG 01/09/2022 Negative  Negative Final    INTERNAL CONTROL 01/09/2022 Valid   Final     Deja Kaigler, LPN

## 2022-01-09 NOTE — Progress Notes (Signed)
URGENT CARE, [facility]  [address]  [place] [address]  Phone: [phone]  Fax: [phone]    Encounter Date: 01/09/2022    Patient ID:  Luke Lara  ONG:[id number]    DOB: 04-27-1995  Age: 26 y.o. male    Subjective:     Chief Complaint   Patient presents with    Cough     Tested positive for COVID with a home test on 01/06/22 and 01/08/22      Nasal Drainage    Nasal Congestion    Fever     States had a fever one time three days ago      The patient presents to the clinic for follow up on COVID-19. Pt's daughter tested positive for COVID-19 last week. He tested positive on Friday via home test. He took another home test yesterday and was still positive. Luke Lara has had COVID-19 x 3 times in total including this episode. He is feeling better today. Symptoms started on 01/05/22.     The history is provided by the patient.   Cough  Associated symptoms include a fever (subjective) and rhinorrhea. Pertinent negatives include no sore throat.   Nasal Congestion  Associated symptoms include congestion, coughing and a fever (subjective). Pertinent negatives include no sore throat.   Fever   Associated symptoms include congestion and coughing. Pertinent negatives include no sore throat.     No current outpatient medications on file.     No Known Allergies  History reviewed. No pertinent past medical history.      Family Medical History:    None         Social History     Tobacco Use    Smoking status: Every Day     Packs/day: .5     Types: Cigarettes    Smokeless tobacco: Never   Vaping Use    Vaping Use: Every day       Review of Systems   Constitutional:  Positive for fever (subjective).   HENT:  Positive for congestion and rhinorrhea. Negative for sore throat.    Eyes: Negative.    Respiratory:  Positive for cough.    Cardiovascular: Negative.    Gastrointestinal: Negative.    Skin: Negative.      Objective:   Vitals: BP 122/78 (Site: Upper Extremity, Patient Position: Sitting, Cuff Size:  Adult)   Pulse 74   Temp 36.5 C (97.7 F) (Thermal Scan)   Resp 18   Ht 1.778 m (5\' 10" )   Wt 82.1 kg (181 lb)   SpO2 100%   BMI 25.97 kg/m         Physical Exam  Constitutional:       General: He is not in acute distress.  HENT:      Head: Normocephalic and atraumatic.      Right Ear: Tympanic membrane and ear canal normal.      Left Ear: Tympanic membrane and ear canal normal.      Nose: No congestion.      Mouth/Throat:      Pharynx: No posterior oropharyngeal erythema.   Eyes:      Conjunctiva/sclera: Conjunctivae normal.   Cardiovascular:      Rate and Rhythm: Normal rate and regular rhythm.      Heart sounds: No murmur heard.  Pulmonary:      Breath sounds: No wheezing, rhonchi or rales.   Musculoskeletal:      Cervical back: Neck supple.   Lymphadenopathy:  Cervical: No cervical adenopathy.   Skin:     General: Skin is warm and dry.      Findings: No rash.   Neurological:      General: No focal deficit present.      Mental Status: He is alert and oriented to person, place, and time.       Office Visit on 01/09/2022   Component Date Value Ref Range Status    COVID-19 AG 01/09/2022 Negative  Negative Final    INTERNAL CONTROL 01/09/2022 Valid   Final         Assessment & Plan:     ENCOUNTER DIAGNOSES     ICD-10-CM   1. COVID-19  U07.1   2. Cough, unspecified type  R05.9       Orders Placed This Encounter    POCT Rapid Covid ([facility]) (AMB Only)     Viewed photos with time stamps of pt's home COVID-19 tests  Pt is testing negative for COVID-19 at this point  Discussed current COVID-19 quarantine guidelines  Work excuse provided  Return if symptoms worsen or fail to improve.    Marianna Fuss, NP

## 2022-04-21 ENCOUNTER — Encounter: Payer: Self-pay | Admitting: Family Medicine

## 2022-05-04 ENCOUNTER — Encounter (INDEPENDENT_AMBULATORY_CARE_PROVIDER_SITE_OTHER): Payer: Self-pay | Admitting: NURSE PRACTITIONER

## 2022-05-04 ENCOUNTER — Ambulatory Visit: Payer: 59 | Attending: NURSE PRACTITIONER | Admitting: NURSE PRACTITIONER

## 2022-05-04 ENCOUNTER — Other Ambulatory Visit: Payer: Self-pay

## 2022-05-04 VITALS — BP 130/78 | HR 77 | Temp 97.6°F | Resp 20 | Ht 70.0 in | Wt 188.0 lb

## 2022-05-04 DIAGNOSIS — R059 Cough, unspecified: Secondary | ICD-10-CM | POA: Insufficient documentation

## 2022-05-04 DIAGNOSIS — F17210 Nicotine dependence, cigarettes, uncomplicated: Secondary | ICD-10-CM | POA: Insufficient documentation

## 2022-05-04 DIAGNOSIS — R110 Nausea: Secondary | ICD-10-CM | POA: Insufficient documentation

## 2022-05-04 DIAGNOSIS — J3489 Other specified disorders of nose and nasal sinuses: Secondary | ICD-10-CM | POA: Insufficient documentation

## 2022-05-04 DIAGNOSIS — J029 Acute pharyngitis, unspecified: Secondary | ICD-10-CM | POA: Insufficient documentation

## 2022-05-04 DIAGNOSIS — J069 Acute upper respiratory infection, unspecified: Secondary | ICD-10-CM | POA: Insufficient documentation

## 2022-05-04 LAB — POCT RAPID STREP A: RAPID STREP A (POCT): NEGATIVE

## 2022-05-04 MED ORDER — ALBUTEROL SULFATE HFA 90 MCG/ACTUATION AEROSOL INHALER
1.0000 | INHALATION_SPRAY | RESPIRATORY_TRACT | 0 refills | Status: DC | PRN
Start: 2022-05-04 — End: 2022-09-27

## 2022-05-04 MED ORDER — AZITHROMYCIN 250 MG TABLET
ORAL_TABLET | ORAL | 0 refills | Status: DC
Start: 2022-05-04 — End: 2022-09-27

## 2022-05-04 MED ORDER — ONDANSETRON 4 MG DISINTEGRATING TABLET
4.0000 mg | ORAL_TABLET | Freq: Three times a day (TID) | ORAL | 0 refills | Status: DC | PRN
Start: 2022-05-04 — End: 2022-09-27

## 2022-05-04 NOTE — Addendum Note (Signed)
Addended by: Lyndee Hensen A on: 05/04/2022 10:41 AM     Modules accepted: Orders

## 2022-05-04 NOTE — Progress Notes (Signed)
URGENT CARE, [facility]  [address]  [place] [address]  Phone: [phone]  Fax: [phone]    Encounter Date: 05/04/2022    Patient ID:  Luke Lara  [id number]    DOB: [birth year]  Age: 27 y.o. male    Subjective:     Chief Complaint   Patient presents with    Nausea     Started two days ago     Cough     Started today     Nasal Drainage    Sore Throat     Luke Lara presents to the clinic with c/o symptoms x 2 days. He started feeling nauseated, some abdominal pain with sore throat. He did initially run a fever the first day 99.47F resolved. He reports he does have a cough with some intermittent production of mucus. He has been taking nyquil at night with relief. Denies chills, body aches, headache, ear pain, sob, cp, vomiting or diarrhea. Denies known ill exposures. He feels he is eating, drinking and voiding normal.     The history is provided by the patient.   Nausea  Associated symptoms include abdominal pain, coughing, nausea and a sore throat. Pertinent negatives include no arthralgias, chest pain, chills, congestion, fatigue, fever, headaches, myalgias, neck pain, rash or vomiting.   Cough  Associated symptoms include a sore throat. Pertinent negatives include no chest pain, chills, ear pain, fever, headaches, myalgias, postnasal drip, rash, rhinorrhea, shortness of breath or wheezing.   Sore Throat  Associated symptoms include abdominal pain, coughing, nausea and a sore throat. Pertinent negatives include no arthralgias, chest pain, chills, congestion, fatigue, fever, headaches, myalgias, neck pain, rash or vomiting.     Current Outpatient Medications   Medication Sig    albuterol sulfate (PROVENTIL OR VENTOLIN OR PROAIR) 90 mcg/actuation Inhalation oral inhaler Take 1-2 Puffs by inhalation Every 4 hours as needed    azithromycin (ZITHROMAX) 250 mg Oral Tablet Take 500 mg (2 tab) on day 1; take 250 mg (1 tab) on days 2-5.    ondansetron (ZOFRAN ODT) 4 mg Oral Tablet, Rapid  Dissolve Take 1 Tablet (4 mg total) by mouth Every 8 hours as needed for Nausea/Vomiting     No Known Allergies  History reviewed. No pertinent past medical history.      Family Medical History:    None         Social History     Tobacco Use    Smoking status: Every Day     Current packs/day: 0.50     Types: Cigarettes    Smokeless tobacco: Never   Vaping Use    Vaping status: Every Day       Review of Systems   Constitutional:  Negative for chills, fatigue and fever.   HENT:  Positive for sore throat. Negative for congestion, ear discharge, ear pain, postnasal drip, rhinorrhea, sinus pressure, sinus pain and sneezing.    Eyes:  Negative for pain, discharge and itching.   Respiratory:  Positive for cough. Negative for chest tightness, shortness of breath and wheezing.    Cardiovascular:  Negative for chest pain.   Gastrointestinal:  Positive for abdominal pain and nausea. Negative for diarrhea and vomiting.   Musculoskeletal:  Negative for arthralgias, myalgias, neck pain and neck stiffness.   Skin:  Negative for color change, pallor and rash.   Neurological:  Negative for dizziness, facial asymmetry and headaches.   Hematological:  Negative for adenopathy.     Objective:   Vitals:  BP 130/78 (Site: Upper Extremity, Patient Position: Sitting, Cuff Size: Adult)   Pulse 77   Temp 36.4 C (97.6 F) (Thermal Scan)   Resp 20   Ht 1.778 m ( )   Wt 85.3 kg (188 lb)   SpO2 99%   BMI 26.98 kg/m         Physical Exam  Vitals and nursing note reviewed.   Constitutional:       General: He is awake. He is not in acute distress.     Appearance: Normal appearance. He is well-developed, well-groomed and normal weight. He is not ill-appearing, toxic-appearing or diaphoretic.   HENT:      Head: Normocephalic and atraumatic.      Right Ear: Hearing, tympanic membrane, ear canal and external ear normal. No decreased hearing noted. No drainage or tenderness. No middle ear effusion. There is no impacted cerumen. Tympanic  membrane is not perforated, erythematous or bulging.      Left Ear: Hearing, tympanic membrane, ear canal and external ear normal. No decreased hearing noted. No drainage or tenderness.  No middle ear effusion. There is no impacted cerumen. Tympanic membrane is not perforated, erythematous or bulging.      Nose: Nose normal. No congestion or rhinorrhea.      Right Sinus: No maxillary sinus tenderness or frontal sinus tenderness.      Left Sinus: No maxillary sinus tenderness or frontal sinus tenderness.      Mouth/Throat:      Lips: Pink. No lesions.      Mouth: Mucous membranes are moist. No oral lesions.      Pharynx: Oropharynx is clear. Uvula midline. Posterior oropharyngeal erythema present. No pharyngeal swelling or oropharyngeal exudate.   Eyes:      General: Lids are normal. No scleral icterus.        Right eye: No discharge.         Left eye: No discharge.      Extraocular Movements: Extraocular movements intact.      Conjunctiva/sclera: Conjunctivae normal.      Pupils: Pupils are equal, round, and reactive to light. Pupils are equal.      Right eye: Pupil is not sluggish.      Left eye: Pupil is not sluggish.   Cardiovascular:      Rate and Rhythm: Normal rate and regular rhythm.      Pulses: Normal pulses.      Heart sounds: Normal heart sounds, S1 normal and S2 normal. No murmur heard.     No friction rub. No gallop.   Pulmonary:      Effort: Pulmonary effort is normal. No respiratory distress.      Breath sounds: Normal breath sounds and air entry. No stridor or decreased air movement. No wheezing, rhonchi or rales.      Comments: Throat clearing cough, mildly diminished bases    Chest:      Chest wall: No tenderness.   Abdominal:      General: Abdomen is flat. Bowel sounds are normal. There is no distension.      Palpations: Abdomen is soft.      Tenderness: There is no abdominal tenderness. There is no right CVA tenderness, left CVA tenderness, guarding or rebound.      Hernia: No hernia is present.    Musculoskeletal:         General: No signs of injury.      Cervical back: Normal range of motion and neck supple. No rigidity  or tenderness.      Right lower leg: No edema.      Left lower leg: No edema.   Lymphadenopathy:      Cervical: No cervical adenopathy.   Skin:     General: Skin is warm and dry.      Capillary Refill: Capillary refill takes less than 2 seconds.      Coloration: Skin is not cyanotic, jaundiced, mottled, pale or sallow.      Findings: No bruising, erythema, lesion or rash.   Neurological:      General: No focal deficit present.      Mental Status: He is alert and oriented to person, place, and time. Mental status is at baseline.      GCS: GCS eye subscore is 4. GCS verbal subscore is 5. GCS motor subscore is 6.   Psychiatric:         Attention and Perception: Attention and perception normal.         Mood and Affect: Mood and affect normal.         Speech: Speech normal.         Behavior: Behavior normal. Behavior is cooperative.         Assessment & Plan:     ENCOUNTER DIAGNOSES     ICD-10-CM   1. URI with cough and congestion  J06.9   2. Nausea  R11.0   3. Cough, unspecified type  R05.9   4. Sore throat  J02.9   5. Sinus drainage  J34.89       Orders Placed This Encounter    azithromycin (ZITHROMAX) 250 mg Oral Tablet    albuterol sulfate (PROVENTIL OR VENTOLIN OR PROAIR) 90 mcg/actuation Inhalation oral inhaler    ondansetron (ZOFRAN ODT) 4 mg Oral Tablet, Rapid Dissolve       Return if symptoms worsen or fail to improve, for Follow up with PCP for new/worsening concerns, Return for new or worsening concerns.  Patient felt stable for f/u prn. Discussed and reviewed plan of care with patient/care giver.  Instructions was given to the patient/care giver.  Questions sufficiently answered as needed.  Patient/care giver encouraged to follow up with PCP as indicated.  In the event of an emergency, patient/care giver instructed to call 911 or go to the nearest emergency room.   This note may have  been partially generated using M-Modal Fluency Direct system, and there may be some incorrect words, spellings, and punctuation that were not noted in checking the note before saving.    Lance Muss, FNP-BC

## 2022-05-04 NOTE — Nursing Note (Signed)
Travel Screening       Question Response    Have you been in contact with someone who was sick? --    Do you have any of the following new or worsening symptoms? Cough    Have you traveled internationally in the last month? No          Travel History   Travel since 04/03/22    No documented travel since 04/03/22       Valissa Lyvers, LPN

## 2022-05-04 NOTE — Nursing Note (Signed)
Office Visit on 05/04/2022   Component Date Value Ref Range Status    RAPID STREP A (POCT) 05/04/2022 Negative   Final     Ellwyn Ergle, LPN

## 2022-09-27 ENCOUNTER — Encounter (HOSPITAL_COMMUNITY): Payer: Self-pay

## 2022-09-27 ENCOUNTER — Emergency Department
Admission: EM | Admit: 2022-09-27 | Discharge: 2022-09-27 | Disposition: A | Discharge status: Home / Self Care | Payer: 59 | Attending: Family Medicine | Admitting: Family Medicine

## 2022-09-27 ENCOUNTER — Other Ambulatory Visit: Payer: Self-pay

## 2022-09-27 DIAGNOSIS — R030 Elevated blood-pressure reading, without diagnosis of hypertension: Secondary | ICD-10-CM | POA: Insufficient documentation

## 2022-09-27 LAB — CBC WITH DIFF
BASOPHIL #: <0.1 10*3/uL (ref <=0.20)
BASOPHIL %: 0 %
EOSINOPHIL #: 0.19 10*3/uL (ref <=0.50)
EOSINOPHIL %: 2 %
HCT: 44.7 % (ref 38.9–52.0)
HGB: 15.2 g/dL (ref 13.4–17.5)
IMMATURE GRANULOCYTE #: <0.1 10*3/uL (ref <0.10)
IMMATURE GRANULOCYTE %: 0 % (ref 0.0–1.0)
LYMPHOCYTE #: 2.21 10*3/uL (ref 1.00–4.80)
LYMPHOCYTE %: 25 %
MCH: 31.7 pg (ref 26.0–32.0)
MCHC: 34 g/dL (ref 31.0–35.5)
MCV: 93.3 fL (ref 78.0–100.0)
MONOCYTE #: 0.72 10*3/uL (ref 0.20–1.10)
MONOCYTE %: 8 %
MPV: 10 fL (ref 8.7–12.5)
NEUTROPHIL #: 5.76 10*3/uL (ref 1.50–7.70)
NEUTROPHIL %: 65 %
PLATELETS: 161 10*3/uL (ref 150–400)
RBC: 4.79 10*6/uL (ref 4.50–6.10)
RDW-CV: 12.5 % (ref 11.5–15.5)
WBC: 8.9 10*3/uL (ref 3.7–11.0)

## 2022-09-27 LAB — URINALYSIS, MACROSCOPIC
BILIRUBIN: NEGATIVE mg/dL
BLOOD: NEGATIVE mg/dL
GLUCOSE: NEGATIVE mg/dL
KETONES: NEGATIVE mg/dL
LEUKOCYTES: NEGATIVE WBCs/uL
NITRITE: NEGATIVE
PH: 8 (ref 4.6–8.0)
PROTEIN: NEGATIVE mg/dL
SPECIFIC GRAVITY: 1.015 (ref 1.005–1.030)
UROBILINOGEN: 0.2 mg/dL (ref 0.2–1.0)

## 2022-09-27 LAB — BASIC METABOLIC PANEL
ANION GAP: 8 mmol/L (ref 4–13)
BUN/CREA RATIO: 14 (ref 6–22)
BUN: 14 mg/dL (ref 8–25)
CALCIUM: 9.7 mg/dL (ref 8.6–10.2)
CHLORIDE: 106 mmol/L (ref 96–111)
CO2 TOTAL: 29 mmol/L (ref 22–30)
CREATININE: 0.99 mg/dL (ref 0.75–1.35)
ESTIMATED GFR - MALE: >90 mL/min/BSA (ref >=60)
GLUCOSE: 95 mg/dL (ref 65–125)
POTASSIUM: 4.1 mmol/L (ref 3.5–5.1)
SODIUM: 143 mmol/L (ref 136–145)

## 2022-09-27 LAB — D-DIMER: D-DIMER: 0.48 mg/L (ref <=0.50)

## 2022-09-27 LAB — URINALYSIS, MICROSCOPIC

## 2022-09-27 LAB — MAGNESIUM: MAGNESIUM: 2.1 mg/dL (ref 1.8–2.6)

## 2022-09-27 NOTE — ED Provider Notes (Signed)
[facility]  ED Primary Provider Note  History of Present Illness   Chief Complaint   Patient presents with    Hypertension     Luke Lara is a 27 y.o. male who had concerns including Hypertension.  Arrival: The patient arrived by Car    Patient to ER with elevated blood pressure readings at home.  Patient was seen at couple in clinic a couple of weeks ago and at that time his blood pressure was elevated since that time he was advised to keep an eye on it.  His systolic blood pressure at home was around 150 and patient was concerned came to the ER for further evaluation patient denies any chest pain or shortness of breath.  He denies any facial flushing headache.  Patient does complain of some pain in his posterior knee on the right states that this began yesterday is intermittent he denies any lower extremity redness or swelling he denies any recent travel he denies any history of blood clots or family history of blood clot      Hypertension    History Reviewed This Encounter: Medical History  Surgical History  Family History  Social History    Physical Exam   ED Triage Vitals [09/27/22 2013]   BP (Non-Invasive) 138/78   Heart Rate 81   Respiratory Rate 18   Temperature 36.8 C (98.2 F)   SpO2 100 %   Weight 83.5 kg (184 lb)   Height 1.778 m (5\' 10" )     Physical Exam  Constitutional:       General: He is not in acute distress.     Appearance: He is not ill-appearing.   HENT:      Head: Normocephalic and atraumatic.      Mouth/Throat:      Mouth: Mucous membranes are moist.   Eyes:      Extraocular Movements: Extraocular movements intact.      Pupils: Pupils are equal, round, and reactive to light.   Cardiovascular:      Rate and Rhythm: Normal rate and regular rhythm.      Pulses: Normal pulses.      Heart sounds: Normal heart sounds.   Pulmonary:      Effort: Pulmonary effort is normal.      Breath sounds: Normal breath sounds. No wheezing, rhonchi or rales.    Musculoskeletal:         General: No swelling. Normal range of motion.   Skin:     General: Skin is warm and dry.      Capillary Refill: Capillary refill takes less than 2 seconds.      Coloration: Skin is not jaundiced or pale.   Neurological:      General: No focal deficit present.      Mental Status: He is alert and oriented to person, place, and time.   Psychiatric:         Mood and Affect: Mood normal.         Behavior: Behavior normal.         Thought Content: Thought content normal.         Judgment: Judgment normal.       Patient Data     Labs Ordered/Reviewed   BASIC METABOLIC PANEL - Normal   MAGNESIUM - Normal   URINALYSIS, MACROSCOPIC - Normal   URINALYSIS, MICROSCOPIC - Normal   D-DIMER - Normal    Narrative:     According to the  assay manufacturer's published package insert, a normal (<0.50 mg/L FEU) D-Dimer result in conjunction with a non-high clinical probability assessment, excludes deep vein thrombosis (DVT) and pulmonary embolism (PE) with high sensitivity.  D-Dimer increases with age and this can make VTE exclusion of an older population difficult. To address this, the [employer], based on best available evidence and recent guidelines, recommends that clinicians use age-adjusted D-Dimer thresholds in patients greater than 71 years of age with: a) a low probability of PE who do not meet all Pulmonary Embolism Rule Out Criteria, or b) in those with intermediate probability of PE. The formula for an age-adjusted D-Dimer cut-off is "age/100". For example, a 25 year old patient would have an age-adjusted cut-off of 0.60 mg/L FEU and an 25 year old 0.80 mg/L FEU.   CBC/DIFF    Narrative:     The following orders were created for panel order CBC/DIFF.  Procedure                               Abnormality         Status                     ---------                               -----------         ------                     CBC WITH ZOXW[[id number]]                                     Final result                 Please view results for these tests on the individual orders.   URINALYSIS, MACROSCOPIC AND MICROSCOPIC W/CULTURE REFLEX    Narrative:     The following orders were created for panel order URINALYSIS, MACROSCOPIC AND MICROSCOPIC W/CULTURE REFLEX.  Procedure                               Abnormality         Status                     ---------                               -----------         ------                     URINALYSIS, MACROSCOPIC[[id number]]      Normal              Final result               URINALYSIS, MICROSCOPIC[[id number]]      Normal              Final result                 Please view results for these tests on the individual orders.   CBC WITH DIFF     No orders to display  Medical Decision Making        Medical Decision Making  Patient with elevated blood pressure over the past couple of weeks blood pressure is not in a dangerous range at this time it is in the 130s systolic check baseline labs to rule out any renal issues or low potassium    Problems Addressed:  Elevated blood pressure reading: acute illness or injury     Details: Please obtain PCP asap and continue to monitor blood pressure twice daily return to the ER if greater than 180 over 110    Amount and/or Complexity of Data Reviewed  Labs: ordered.                   Clinical Impression   Elevated blood pressure reading (Primary)       Disposition: Discharged

## 2022-09-27 NOTE — ED Triage Notes (Signed)
Patient states he got home from work this evening and wasn't feeling well, checked BP and it was 146/89.  Denies chest pain or shortness of breath.

## 2022-09-27 NOTE — ED Nurses Note (Signed)
Patient discharged home.  AVS reviewed with patient/care giver.  A written copy of the AVS and discharge instructions was given to the patient/care giver.  Questions sufficiently answered as needed.  Patient/care giver encouraged to follow up with PCP as indicated.  In the event of an emergency, patient/care giver instructed to call 911 or go to the nearest emergency room.

## 2022-09-27 NOTE — Discharge Instructions (Signed)
Please continue to check your blood pressure twice daily    Please return to the emergency department if your blood pressure is more than 180s systolic/top number or 110 diastolic/bottom number    Please establish with a PCP/family doctor as soon as possible

## 2022-09-28 ENCOUNTER — Encounter (HOSPITAL_COMMUNITY): Payer: Self-pay | Admitting: Family Medicine

## 2022-09-28 LAB — BLUE TOP TUBE

## 2023-03-01 ENCOUNTER — Encounter (INDEPENDENT_AMBULATORY_CARE_PROVIDER_SITE_OTHER): Payer: Self-pay | Admitting: NURSE PRACTITIONER

## 2023-03-01 ENCOUNTER — Other Ambulatory Visit: Payer: Self-pay

## 2023-03-01 ENCOUNTER — Ambulatory Visit: Payer: Commercial Managed Care - PPO | Attending: NURSE PRACTITIONER | Admitting: NURSE PRACTITIONER

## 2023-03-01 VITALS — BP 144/68 | HR 74 | Temp 97.6°F | Resp 20 | Ht 70.0 in | Wt 183.6 lb

## 2023-03-01 DIAGNOSIS — R110 Nausea: Secondary | ICD-10-CM | POA: Insufficient documentation

## 2023-03-01 DIAGNOSIS — G43909 Migraine, unspecified, not intractable, without status migrainosus: Secondary | ICD-10-CM | POA: Insufficient documentation

## 2023-03-01 DIAGNOSIS — R519 Headache, unspecified: Secondary | ICD-10-CM | POA: Insufficient documentation

## 2023-03-01 LAB — POCT RAPID STREP A: RAPID STREP A (POCT): NEGATIVE

## 2023-03-01 LAB — POCT INFLUENZA A & B
INFLUENZA A: NEGATIVE
INFLUENZA B: NEGATIVE

## 2023-03-01 MED ORDER — ONDANSETRON 4 MG DISINTEGRATING TABLET
8.0000 mg | ORAL_TABLET | ORAL | Status: AC
Start: 2023-03-01 — End: 2023-03-01
  Administered 2023-03-01: 8 mg via ORAL

## 2023-03-01 MED ORDER — DEXAMETHASONE SODIUM PHOSPHATE 4 MG/ML INJECTION SOLUTION
4.0000 mg | INTRAMUSCULAR | Status: DC
Start: 2023-03-01 — End: 2023-03-01

## 2023-03-01 MED ORDER — KETOROLAC 60 MG/2 ML INTRAMUSCULAR SOLUTION
60.0000 mg | Freq: Once | INTRAMUSCULAR | Status: AC
Start: 2023-03-01 — End: 2023-03-01
  Administered 2023-03-01: 60 mg via INTRAMUSCULAR

## 2023-03-01 MED ORDER — ONDANSETRON 4 MG DISINTEGRATING TABLET
4.0000 mg | ORAL_TABLET | Freq: Three times a day (TID) | ORAL | 0 refills | Status: AC | PRN
Start: 2023-03-01 — End: ?

## 2023-03-01 MED ORDER — DEXAMETHASONE SODIUM PHOSPHATE 4 MG/ML INJECTION SOLUTION
8.0000 mg | INTRAMUSCULAR | Status: DC
Start: 2023-03-01 — End: 2023-03-01

## 2023-03-01 NOTE — Nursing Note (Signed)
Travel Screening       Question Response    Have you been in contact with someone who was sick? --    Do you have any of the following new or worsening symptoms? None of these    Have you traveled internationally in the last month? No          Travel History   Travel since 01/29/23    No documented travel since 01/29/23       Broc Caspers, LPN

## 2023-03-01 NOTE — Nursing Note (Signed)
Office Visit on 03/01/2023   Component Date Value Ref Range Status    INFLUENZA A 03/01/2023 Negative   Final    INFLUENZA B 03/01/2023 Negative   Final    RAPID STREP A (POCT) 03/01/2023 Negative   Final     Dekendrick Uzelac, LPN

## 2023-03-01 NOTE — Progress Notes (Signed)
URGENT CARE, [facility]  [address]  [place] [address]    Progress Note    Encounter Date: 03/01/2023    Patient ID:  Luke Lara  JYN:[id number]    DOB: 1995-07-03  Age: 28 y.o. male  Subjective   Subjective:     Chief Complaint   Patient presents with    Sinus Pain    Headache     Started two hours ago     Nausea     Aeneas presents to the clinic for headache to forehead and across right eye that started with in past 2 hours. He does feel nauseated and more sensitive to light and sound. He had to leave work early around 3pm. No medications pta. Usually takes Excedrin for relief. History of ocular migraine. Typically happens about twice a month with TV static to eyes. Feels like this is different headache. Denies head injury.  Drank two bottles of water today.     The history is provided by the patient.   Sinus Pain   Associated symptoms include nausea and photophobia. Pertinent negatives include no abdominal pain, coughing, dizziness, ear pain, eye pain, fever, neck pain, rhinorrhea, sinus pressure, sore throat or vomiting.   Headache   Associated symptoms include nausea and photophobia. Pertinent negatives include no abdominal pain, coughing, dizziness, ear pain, eye pain, fever, neck pain, rhinorrhea, sinus pressure, sore throat or vomiting.   Nausea  Associated symptoms include headaches and nausea. Pertinent negatives include no abdominal pain, arthralgias, chest pain, chills, congestion, coughing, fatigue, fever, myalgias, neck pain, rash, sore throat or vomiting.     Current Outpatient Medications   Medication Sig    ondansetron (ZOFRAN ODT) 4 mg Oral Tablet, Rapid Dissolve Take 1 Tablet (4 mg total) by mouth Every 8 hours as needed for Nausea/Vomiting     No Known Allergies  History reviewed. No pertinent past medical history.            Family Medical History:    None         Social History     Tobacco Use    Smoking status: Every Day     Current packs/day: 0.50     Types:  Cigarettes    Smokeless tobacco: Never   Vaping Use    Vaping status: Every Day   Substance Use Topics    Alcohol use: Never    Drug use: Never       Review of Systems   Constitutional:  Negative for chills, fatigue and fever.   HENT:  Positive for sinus pain. Negative for congestion, ear discharge, ear pain, postnasal drip, rhinorrhea, sinus pressure, sneezing and sore throat.    Eyes:  Positive for photophobia. Negative for pain, discharge and itching.   Respiratory:  Negative for cough, chest tightness, shortness of breath and wheezing.    Cardiovascular:  Negative for chest pain.   Gastrointestinal:  Positive for nausea. Negative for abdominal pain, diarrhea and vomiting.   Musculoskeletal:  Negative for arthralgias, myalgias, neck pain and neck stiffness.   Skin:  Negative for color change, pallor and rash.   Neurological:  Positive for headaches. Negative for dizziness and facial asymmetry.   Hematological:  Negative for adenopathy.        Objective   Objective:   Vitals: BP (!) 144/68 (Site: Right Arm, Patient Position: Sitting, Cuff Size: Adult)   Pulse 74   Temp 36.4 C (97.6 F) (Thermal Scan)   Resp 20  Ht 1.778 m (5\' 10" )   Wt 83.3 kg (183 lb 9.6 oz)   SpO2 99%   BMI 26.34 kg/m         Physical Exam  Vitals and nursing note reviewed.   Constitutional:       General: He is awake. He is not in acute distress.     Appearance: Normal appearance. He is well-developed, well-groomed and normal weight. He is not ill-appearing, toxic-appearing or diaphoretic.   HENT:      Head: Normocephalic and atraumatic.        Comments: Right forehead pain around right eye       Right Ear: Hearing, ear canal and external ear normal. No decreased hearing noted. No drainage or tenderness. A middle ear effusion is present. There is no impacted cerumen. Tympanic membrane is not perforated, erythematous or bulging.      Left Ear: Hearing, tympanic membrane, ear canal and external ear normal. No decreased hearing noted. No  drainage or tenderness.  No middle ear effusion. There is no impacted cerumen. Tympanic membrane is not perforated, erythematous or bulging.      Nose: Nose normal. No congestion or rhinorrhea.      Right Sinus: No maxillary sinus tenderness or frontal sinus tenderness.      Left Sinus: No maxillary sinus tenderness or frontal sinus tenderness.      Mouth/Throat:      Lips: Pink. No lesions.      Mouth: Mucous membranes are moist. No oral lesions.      Pharynx: Oropharynx is clear. Uvula midline. Posterior oropharyngeal erythema present. No pharyngeal swelling or oropharyngeal exudate.   Eyes:      General: Lids are normal. No scleral icterus.        Right eye: No discharge.         Left eye: No discharge.      Extraocular Movements: Extraocular movements intact.      Conjunctiva/sclera: Conjunctivae normal.      Pupils: Pupils are equal, round, and reactive to light. Pupils are equal.      Right eye: Pupil is not sluggish.      Left eye: Pupil is not sluggish.   Cardiovascular:      Rate and Rhythm: Normal rate and regular rhythm.      Pulses: Normal pulses.      Heart sounds: Normal heart sounds, S1 normal and S2 normal. No murmur heard.     No friction rub. No gallop.   Pulmonary:      Effort: Pulmonary effort is normal. No respiratory distress.      Breath sounds: Normal breath sounds and air entry. No stridor or decreased air movement. No wheezing, rhonchi or rales.   Chest:      Chest wall: No tenderness.   Abdominal:      General: Abdomen is flat. Bowel sounds are normal. There is no distension.      Palpations: Abdomen is soft.      Tenderness: There is no abdominal tenderness. There is no right CVA tenderness, left CVA tenderness, guarding or rebound.      Hernia: No hernia is present.   Musculoskeletal:         General: No signs of injury.      Cervical back: Normal range of motion and neck supple. No rigidity or tenderness.      Right lower leg: No edema.      Left lower leg: No edema.   Lymphadenopathy:  Cervical: No cervical adenopathy.   Skin:     General: Skin is warm and dry.      Capillary Refill: Capillary refill takes less than 2 seconds.      Coloration: Skin is not cyanotic, jaundiced, mottled, pale or sallow.      Findings: No bruising, erythema, lesion or rash.   Neurological:      General: No focal deficit present.      Mental Status: He is alert and oriented to person, place, and time. Mental status is at baseline.      GCS: GCS eye subscore is 4. GCS verbal subscore is 5. GCS motor subscore is 6.   Psychiatric:         Attention and Perception: Attention and perception normal.         Mood and Affect: Mood and affect normal.         Speech: Speech normal.         Behavior: Behavior normal. Behavior is cooperative.          Assessment & Plan:     ENCOUNTER DIAGNOSES     ICD-10-CM   1. Migraine  G43.909   2. Headache  R51.9   3. Nausea  R11.0     Office Visit on 03/01/2023   Component Date Value Ref Range Status    INFLUENZA A 03/01/2023 Negative   Final    INFLUENZA B 03/01/2023 Negative   Final    RAPID STREP A (POCT) 03/01/2023 Negative   Final       Orders Placed This Encounter    POCT INFLUENZA A & B    POCT RAPID STREP A    ondansetron (ZOFRAN ODT) 4 mg Oral Tablet, Rapid Dissolve    ondansetron (ZOFRAN ODT) rapid dissolve tablet    ketorolac (TORADOL) 60mg /2 mL IM injection       Pt overall healthy appearing on exam  Discussed migraines.   Toradol Im clinic administered  Ordered decadron IM to help with headache, and h/o ocular migraines, due to supplies, cancelled order  Zofran 8mg  sl nausea, with script to go home with   Encourage good hydration, rest, work excuse  Discussed OTC tylenol/motrin for pain at home  Discussed s/s that warrant immediate evaluation or to return for re-evaluation  Pt verbalized understanding and agreeable to poc  Return if symptoms worsen or fail to improve, for Follow up with PCP for new/worsening concerns, Return for new or worsening concerns.  Patient felt  stable for f/u prn. Discussed and reviewed plan of care with patient/care giver.  Instructions was given to the patient/care giver.  Questions sufficiently answered as needed.  Patient/care giver encouraged to follow up with PCP as indicated.  In the event of an emergency, patient/care giver instructed to call 911 or go to the nearest emergency room.   This note may have been partially generated using M-Modal Fluency Direct system, and there may be some incorrect words, spellings, and punctuation that were not noted in checking the note before saving.    Lance Muss, FNP-BC
